# Patient Record
Sex: Female | Born: 1990 | Race: Black or African American | Hispanic: No | Marital: Single | Smoking: Never smoker
Health system: Southern US, Community
[De-identification: ages and names within clinical notes are randomized; demographics above are authoritative.]

## PROBLEM LIST (undated history)

## (undated) HISTORY — PX: HERNIA REPAIR: SHX51

---

## 2010-08-06 ENCOUNTER — Ambulatory Visit
Admission: RE | Admit: 2010-08-06 | Discharge: 2010-08-06 | Disposition: A | Discharge status: Home / Self Care | Payer: BC Managed Care – PPO | Source: Ambulatory Visit | Attending: Allergy | Admitting: Allergy

## 2010-08-06 ENCOUNTER — Other Ambulatory Visit: Payer: Self-pay | Admitting: Allergy

## 2010-08-06 DIAGNOSIS — J45909 Unspecified asthma, uncomplicated: Secondary | ICD-10-CM

## 2010-09-27 ENCOUNTER — Other Ambulatory Visit (HOSPITAL_COMMUNITY)
Admission: RE | Admit: 2010-09-27 | Discharge: 2010-09-27 | Disposition: A | Discharge status: Home / Self Care | Payer: BC Managed Care – PPO | Source: Ambulatory Visit | Attending: Family Medicine | Admitting: Family Medicine

## 2010-09-27 DIAGNOSIS — Z113 Encounter for screening for infections with a predominantly sexual mode of transmission: Secondary | ICD-10-CM | POA: Insufficient documentation

## 2010-09-27 DIAGNOSIS — Z01419 Encounter for gynecological examination (general) (routine) without abnormal findings: Secondary | ICD-10-CM | POA: Insufficient documentation

## 2011-11-15 ENCOUNTER — Other Ambulatory Visit (HOSPITAL_COMMUNITY)
Admission: RE | Admit: 2011-11-15 | Discharge: 2011-11-15 | Disposition: A | Discharge status: Home / Self Care | Payer: BC Managed Care – PPO | Source: Ambulatory Visit | Attending: Internal Medicine | Admitting: Internal Medicine

## 2011-11-15 DIAGNOSIS — Z113 Encounter for screening for infections with a predominantly sexual mode of transmission: Secondary | ICD-10-CM | POA: Insufficient documentation

## 2011-11-15 DIAGNOSIS — Z01419 Encounter for gynecological examination (general) (routine) without abnormal findings: Secondary | ICD-10-CM | POA: Insufficient documentation

## 2013-05-22 ENCOUNTER — Encounter (HOSPITAL_COMMUNITY): Payer: Self-pay | Admitting: Emergency Medicine

## 2013-05-22 ENCOUNTER — Emergency Department (HOSPITAL_COMMUNITY)
Admission: EM | Admit: 2013-05-22 | Discharge: 2013-05-22 | Disposition: A | Discharge status: Home / Self Care | Payer: BC Managed Care – PPO | Attending: Emergency Medicine | Admitting: Emergency Medicine

## 2013-05-22 ENCOUNTER — Emergency Department (HOSPITAL_COMMUNITY): Payer: BC Managed Care – PPO

## 2013-05-22 DIAGNOSIS — S298XXA Other specified injuries of thorax, initial encounter: Secondary | ICD-10-CM | POA: Insufficient documentation

## 2013-05-22 DIAGNOSIS — R0789 Other chest pain: Secondary | ICD-10-CM

## 2013-05-22 DIAGNOSIS — IMO0002 Reserved for concepts with insufficient information to code with codable children: Secondary | ICD-10-CM | POA: Insufficient documentation

## 2013-05-22 DIAGNOSIS — Z79899 Other long term (current) drug therapy: Secondary | ICD-10-CM | POA: Insufficient documentation

## 2013-05-22 DIAGNOSIS — Y92410 Unspecified street and highway as the place of occurrence of the external cause: Secondary | ICD-10-CM | POA: Insufficient documentation

## 2013-05-22 DIAGNOSIS — Y9389 Activity, other specified: Secondary | ICD-10-CM | POA: Insufficient documentation

## 2013-05-22 MED ORDER — IBUPROFEN 800 MG PO TABS: 800.0000 mg | ORAL_TABLET | Freq: Three times a day (TID) | ORAL | Status: AC | PRN

## 2013-05-22 MED ORDER — HYDROCODONE-ACETAMINOPHEN 5-325 MG PO TABS: 1.0000 | ORAL_TABLET | ORAL | Status: AC | PRN

## 2013-05-22 MED ORDER — HYDROCODONE-ACETAMINOPHEN 5-325 MG PO TABS
2.0000 | ORAL_TABLET | Freq: Once | ORAL | Status: AC
  Administered 2013-05-22: 2 via ORAL
  Filled 2013-05-22: qty 2

## 2013-05-22 MED ORDER — IBUPROFEN 800 MG PO TABS
800.0000 mg | ORAL_TABLET | Freq: Once | ORAL | Status: AC
  Administered 2013-05-22: 800 mg via ORAL
  Filled 2013-05-22: qty 1
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
## 2013-05-24 ENCOUNTER — Other Ambulatory Visit: Payer: Self-pay | Admitting: Family

## 2013-05-24 ENCOUNTER — Ambulatory Visit
Admission: RE | Admit: 2013-05-24 | Discharge: 2013-05-24 | Disposition: A | Discharge status: Home / Self Care | Payer: BC Managed Care – PPO | Source: Ambulatory Visit | Attending: Family | Admitting: Family

## 2013-05-24 DIAGNOSIS — M546 Pain in thoracic spine: Secondary | ICD-10-CM

## 2013-07-03 ENCOUNTER — Ambulatory Visit
Admission: RE | Admit: 2013-07-03 | Discharge: 2013-07-03 | Disposition: A | Discharge status: Home / Self Care | Payer: BC Managed Care – PPO | Source: Ambulatory Visit | Attending: Family | Admitting: Family

## 2013-07-03 ENCOUNTER — Other Ambulatory Visit: Payer: Self-pay | Admitting: Family

## 2013-07-03 DIAGNOSIS — M545 Low back pain, unspecified: Secondary | ICD-10-CM

## 2014-12-10 ENCOUNTER — Other Ambulatory Visit: Payer: Self-pay

## 2014-12-10 ENCOUNTER — Other Ambulatory Visit (HOSPITAL_COMMUNITY)
Admission: RE | Admit: 2014-12-10 | Discharge: 2014-12-10 | Disposition: A | Discharge status: Home / Self Care | Payer: Managed Care, Other (non HMO) | Source: Ambulatory Visit | Attending: Family Medicine | Admitting: Family Medicine

## 2014-12-10 DIAGNOSIS — Z01419 Encounter for gynecological examination (general) (routine) without abnormal findings: Secondary | ICD-10-CM | POA: Diagnosis not present

## 2014-12-12 LAB — CYTOLOGY - PAP
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
## 2015-07-08 ENCOUNTER — Other Ambulatory Visit: Payer: Self-pay | Admitting: Family

## 2015-07-08 DIAGNOSIS — N644 Mastodynia: Secondary | ICD-10-CM

## 2015-07-10 ENCOUNTER — Ambulatory Visit
Admission: RE | Admit: 2015-07-10 | Discharge: 2015-07-10 | Disposition: A | Discharge status: Home / Self Care | Payer: Managed Care, Other (non HMO) | Source: Ambulatory Visit | Attending: Family | Admitting: Family

## 2015-07-10 DIAGNOSIS — N644 Mastodynia: Secondary | ICD-10-CM
